# Patient Record
Sex: Female | Born: 1955 | Race: Black or African American | Hispanic: No | Marital: Married | Smoking: Former smoker
Health system: Southern US, Community
[De-identification: ages and names within clinical notes are randomized; demographics above are authoritative.]

## PROBLEM LIST (undated history)

## (undated) HISTORY — PX: OTHER SURGICAL HISTORY: SHX169

---

## 2021-01-19 ENCOUNTER — Emergency Department (HOSPITAL_BASED_OUTPATIENT_CLINIC_OR_DEPARTMENT_OTHER): Payer: BLUE CROSS/BLUE SHIELD

## 2021-01-19 ENCOUNTER — Encounter (HOSPITAL_BASED_OUTPATIENT_CLINIC_OR_DEPARTMENT_OTHER): Payer: Self-pay

## 2021-01-19 ENCOUNTER — Emergency Department (HOSPITAL_BASED_OUTPATIENT_CLINIC_OR_DEPARTMENT_OTHER)
Admission: EM | Admit: 2021-01-19 | Discharge: 2021-01-19 | Disposition: A | Discharge status: Home / Self Care | Payer: BLUE CROSS/BLUE SHIELD | Attending: Emergency Medicine | Admitting: Emergency Medicine

## 2021-01-19 ENCOUNTER — Other Ambulatory Visit: Payer: Self-pay

## 2021-01-19 DIAGNOSIS — Z87891 Personal history of nicotine dependence: Secondary | ICD-10-CM | POA: Insufficient documentation

## 2021-01-19 DIAGNOSIS — L03114 Cellulitis of left upper limb: Secondary | ICD-10-CM | POA: Insufficient documentation

## 2021-01-19 DIAGNOSIS — M25522 Pain in left elbow: Secondary | ICD-10-CM | POA: Diagnosis present

## 2021-01-19 LAB — CBC WITH DIFFERENTIAL/PLATELET
Abs Immature Granulocytes: 0.02 10*3/uL (ref 0.00–0.07)
Basophils Absolute: 0 10*3/uL (ref 0.0–0.1)
Basophils Relative: 0 %
Eosinophils Absolute: 0 10*3/uL (ref 0.0–0.5)
Eosinophils Relative: 0 %
HCT: 46.4 % — ABNORMAL HIGH (ref 36.0–46.0)
Hemoglobin: 15 g/dL (ref 12.0–15.0)
Immature Granulocytes: 0 %
Lymphocytes Relative: 15 %
Lymphs Abs: 1.1 10*3/uL (ref 0.7–4.0)
MCH: 27.9 pg (ref 26.0–34.0)
MCHC: 32.3 g/dL (ref 30.0–36.0)
MCV: 86.2 fL (ref 80.0–100.0)
Monocytes Absolute: 0.6 10*3/uL (ref 0.1–1.0)
Monocytes Relative: 8 %
Neutro Abs: 5.6 10*3/uL (ref 1.7–7.7)
Neutrophils Relative %: 77 %
Platelets: 200 10*3/uL (ref 150–400)
RBC: 5.38 MIL/uL — ABNORMAL HIGH (ref 3.87–5.11)
RDW: 13.8 % (ref 11.5–15.5)
WBC: 7.3 10*3/uL (ref 4.0–10.5)
nRBC: 0 % (ref 0.0–0.2)

## 2021-01-19 LAB — BASIC METABOLIC PANEL
Anion gap: 11 (ref 5–15)
BUN: 12 mg/dL (ref 8–23)
CO2: 25 mmol/L (ref 22–32)
Calcium: 8.6 mg/dL — ABNORMAL LOW (ref 8.9–10.3)
Chloride: 97 mmol/L — ABNORMAL LOW (ref 98–111)
Creatinine, Ser: 0.57 mg/dL (ref 0.44–1.00)
GFR, Estimated: >60 mL/min (ref >60)
Glucose, Bld: 115 mg/dL — ABNORMAL HIGH (ref 70–99)
Potassium: 3.3 mmol/L — ABNORMAL LOW (ref 3.5–5.1)
Sodium: 133 mmol/L — ABNORMAL LOW (ref 135–145)

## 2021-01-19 MED ORDER — DOXYCYCLINE HYCLATE 100 MG PO TABS
100.0000 mg | ORAL_TABLET | Freq: Once | ORAL | Status: AC
  Administered 2021-01-19: 100 mg via ORAL
  Filled 2021-01-19: qty 1

## 2021-01-19 MED ORDER — KETOROLAC TROMETHAMINE 30 MG/ML IJ SOLN
30.0000 mg | Freq: Once | INTRAMUSCULAR | Status: AC
  Administered 2021-01-19: 30 mg via INTRAVENOUS
  Filled 2021-01-19: qty 1

## 2021-01-19 MED ORDER — IOHEXOL 300 MG/ML  SOLN
100.0000 mL | Freq: Once | INTRAMUSCULAR | Status: AC | PRN
  Administered 2021-01-19: 100 mL via INTRAVENOUS

## 2021-01-19 MED ORDER — DOXYCYCLINE HYCLATE 100 MG PO CAPS: 100.0000 mg | ORAL_CAPSULE | Freq: Two times a day (BID) | ORAL | 0 refills | Status: AC

## 2021-01-19 NOTE — ED Notes (Signed)
Assumed care of this patient. A&Ox4. NAD. Respirations regular/unlabored. Connected to BP and pulse ox. Stretcher low, wheels locked, call bell within reach.

## 2021-01-19 NOTE — ED Triage Notes (Signed)
Pt c/o redness, pain, swelling to left UE x 1 week-denies injury-NAD-steady gait

## 2021-01-19 NOTE — ED Provider Notes (Signed)
[facility] EMERGENCY DEPARTMENT Provider Note   CSN: [id number] Arrival date & time: 01/19/21  2019     History Chief Complaint  Patient presents with  . Arm Problem    Kathryn Montes is a 65 y.o. female.  The history is provided by the patient.  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 No cause for the redness Relieved by:  Nothing Worsened by:  Nothing Ineffective treatments:  None tried Associated symptoms: no abdominal pain, no fever, no joint pain, no periorbital edema, no shortness of breath, no sore throat and not vomiting        History reviewed. No pertinent past medical history.  There are no problems to display for this patient.   Past Surgical History:  Procedure Laterality Date  . arm surgery       OB History   No obstetric history on file.     No family history on file.  Social History   Tobacco Use  . Smoking status: Former Games developer  . Smokeless tobacco: Never Used  Vaping Use  . Vaping Use: Never used  Substance Use Topics  . Alcohol use: Yes    Comment: occ  . Drug use: Never    Home Medications Prior to Admission medications   Not on File    Allergies    Patient has no known allergies.  Review of Systems   Review of Systems  Constitutional: Positive for appetite change. Negative for chills and fever.       Hasn't been eating as much  HENT: Negative for ear pain and sore throat.   Eyes: Negative for pain and visual disturbance.  Respiratory: Negative for cough and shortness of breath.    Cardiovascular: Positive for chest pain (central pain worse when changing positions). Negative for palpitations.  Gastrointestinal: Negative for abdominal pain and vomiting.  Genitourinary: Negative for dysuria and hematuria.  Musculoskeletal: Positive for joint swelling. Negative for arthralgias and back pain.  Skin: Positive for rash. Negative for color change.  Neurological: Negative for seizures and syncope.  All other systems reviewed and are negative.   Physical Exam Updated Vital Signs BP 132/72 (BP Location: Right Arm)   Pulse 96   Temp 99.5 F (37.5 C) (Oral)   Resp 20   Ht 5\' 6"  (1.676 m)   Wt 88.5 kg   SpO2 100%   BMI 31.47 kg/m   Physical Exam Vitals and nursing note reviewed.  Constitutional:      General: She is not in acute distress.    Appearance: She is well-developed.  HENT:     Head: Normocephalic and atraumatic.  Eyes:     Conjunctiva/sclera: Conjunctivae normal.  Cardiovascular:     Rate and Rhythm: Normal rate and regular rhythm.     Heart sounds: No murmur heard.   Pulmonary:     Effort: Pulmonary effort is normal. No respiratory distress.     Breath sounds: Normal breath sounds.  Musculoskeletal:     Cervical back: Neck supple.  Comments: There is a well-healed incision at the medial aspect of the left elbow.  There is swelling beneath this incision and the suggestion of a mass versus fluctuance that includes and extends circumferentially about the medial epicondyle.  There is warmth of the overlying skin and erythema.  Erythema streaks down the ulnar aspect of the forearm all the way to the wrist.  Skin:    General: Skin is warm and dry.  Neurological:     General: No focal deficit present.     Mental Status: She is alert.  Psychiatric:        Mood and Affect: Mood normal.     ED Results / Procedures / Treatments   Labs (all labs ordered are listed, but only abnormal results are displayed) Labs Reviewed  CBC WITH  DIFFERENTIAL/PLATELET - Abnormal; Notable for the following components:      Result Value   RBC 5.38 (*)    HCT 46.4 (*)    All other components within normal limits  BASIC METABOLIC PANEL - Abnormal; Notable for the following components:   Sodium 133 (*)    Potassium 3.3 (*)    Chloride 97 (*)    Glucose, Bld 115 (*)    Calcium 8.6 (*)    All other components within normal limits    EKG None  Radiology DG Elbow 2 Views Left  Result Date: 01/19/2021 CLINICAL DATA:  Left elbow pain, redness, and swelling for 1 week. No known injury. EXAM: LEFT ELBOW - 2 VIEW COMPARISON:  None. FINDINGS: There is no evidence of fracture, dislocation, or joint effusion. No erosion, bony destruction or periosteal reaction. Probable bone island in the distal humerus. Minimal enthesopathic change about the lateral humeral epicondyle. Prominent soft tissue thickening and edema is noted posterior medially. Soft tissue edema appears to extend distally in the forearm. No soft tissue air. No radiopaque foreign body. IMPRESSION: Prominent soft tissue thickening and edema posterior medially suggesting, nonspecific. This could represent cellulitis in the appropriate clinical setting. Gout is also considered. No soft tissue air. No osseous abnormality. Electronically Signed   By: Narda Rutherford M.D.   On: 01/19/2021 21:38   CT ELBOW LEFT W CONTRAST  Result Date: 01/19/2021 CLINICAL DATA:  Left elbow pain.  Swelling. EXAM: CT OF THE UPPER LEFT EXTREMITY WITH CONTRAST TECHNIQUE: Multidetector CT imaging of the upper left extremity was performed according to the standard protocol following intravenous contrast administration. CONTRAST:  OMNIPAQUE IOHEXOL 300 MG/ML  SOLN COMPARISON:  X-ray left elbow 01/19/2021 FINDINGS: Bones/Joint/Cartilage No acute displaced fracture. No dislocation. No cortical erosion or destruction. Densely sclerotic density within the humerus likely represents a bone island. No suspicious lytic or  blastic osseous lesion. Ligaments Suboptimally assessed by CT. Muscles and Tendons Grossly unremarkable. Soft tissues 4 cm soft tissue density with associated surrounding subcutaneus soft tissue edema along the medial elbow. IMPRESSION: 1. Nonspecific 4 cm soft tissue density with associated surrounding subcutaneus soft tissue edema along the medial elbow. 2. No acute osseous abnormality. Electronically Signed   By: Tish Frederickson M.D.   On: 01/19/2021 22:33    Procedures Procedures   Medications Ordered in ED Medications  ketorolac (TORADOL) 30 MG/ML injection 30 mg (has no administration in time range)    ED Course  I have reviewed the triage vital signs and the nursing notes.  Pertinent labs & imaging results that were available during my care of the patient were reviewed by me and considered in my medical decision making (  see chart for details).    MDM Rules/Calculators/A&P                          Kathryn Montes presents complaining of redness and pain to the medial aspect of her left elbow.  She has a cyst that has been present for quite some time, and it appears to be the nidus of infection.  No evidence of an abscess.  The patient is clinically well-appearing, and her white count is normal.  I think it is acceptable to try outpatient management.  She understands that she will need to follow-up if symptoms fail to resolve or if they progress in any way.  After the acute infection has resolved, it would be appropriate to see a specialist to inquire about biopsy or removal of the cyst as her symptoms dictate. Final Clinical Impression(s) / ED Diagnoses Final diagnoses:  Cellulitis of left upper extremity    Rx / DC Orders ED Discharge Orders         Ordered    doxycycline (VIBRAMYCIN) 100 MG capsule  2 times daily        01/19/21 2240    Consult to care management       Comments: Needs to establish care with PCP  Provider:  (Not yet assigned)   01/19/21 2247            Koleen Distance, MD 01/19/21 2249

## 2022-06-23 IMAGING — CT CT ELBOW*L* W/CM
3 of 4 series · 16 of 36 positions shown, 19 images · IV contrast (omnipaque)
Comparison: X-ray left elbow 01/19/2021

CLINICAL DATA: Left elbow pain.  Swelling.

EXAM:
CT OF THE UPPER LEFT EXTREMITY WITH CONTRAST
TECHNIQUE: Multidetector CT imaging of the upper left extremity was performed
according to the standard protocol following intravenous contrast
administration.
CONTRAST:  100mL OMNIPAQUE IOHEXOL 300 MG/ML  SOLN

[Series 5: cor bone · coronal · 0.32mm/px · 1 of 201 slices shown]
[im 101/201  bone]
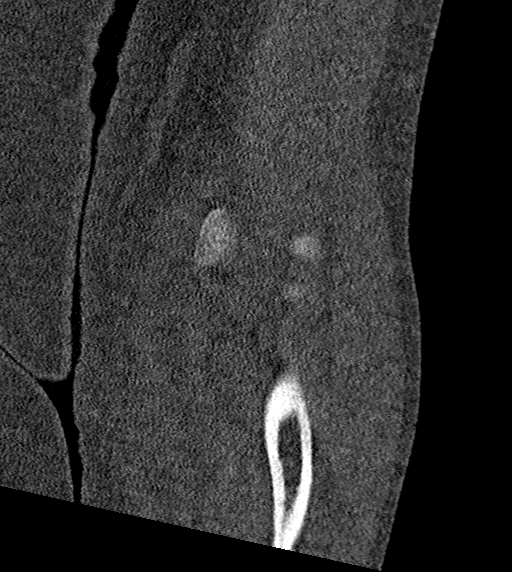

[Series 7: ax st · axial · 0.39mm/px · z∈[-209,-58]mm · 9 of 181 slices shown, 12 images]
[im 14/181  soft-tissue]
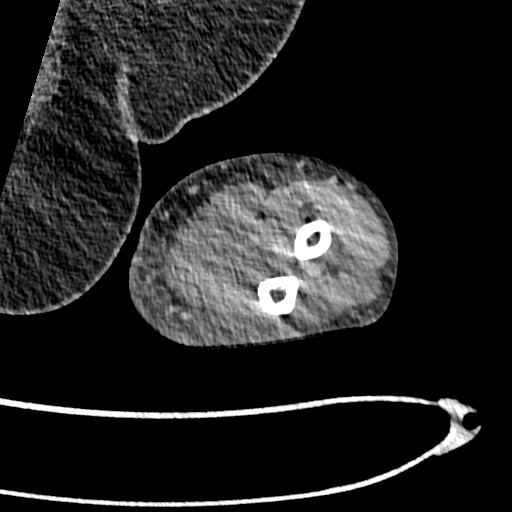
[im 14/181  bone]
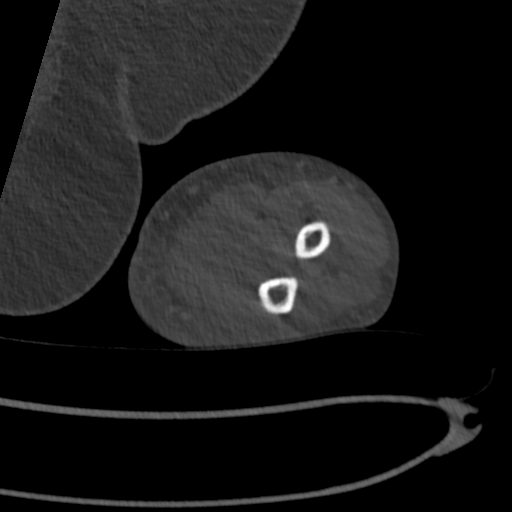
[im 42/181  bone]
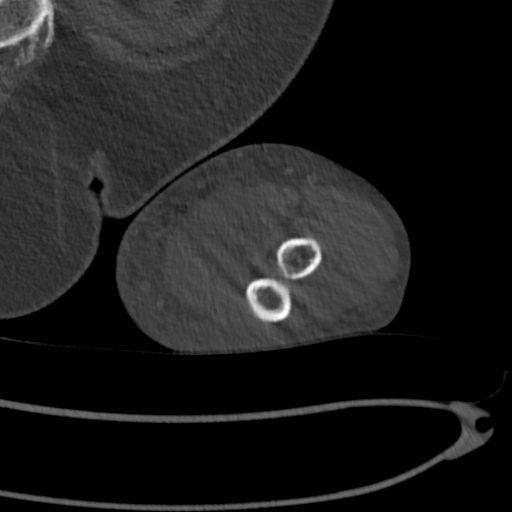
[im 56/181  bone]
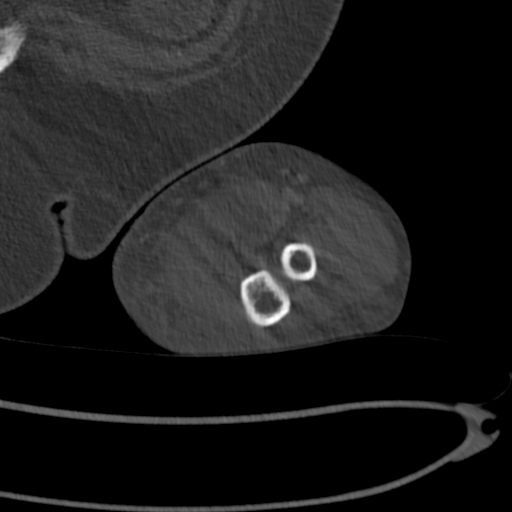
[im 70/181  bone]
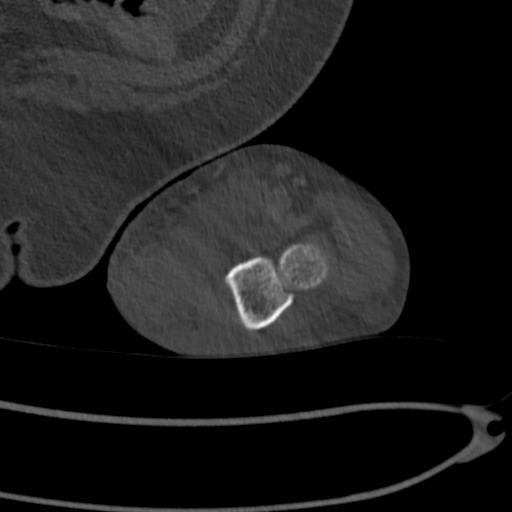
[im 97/181  soft-tissue]
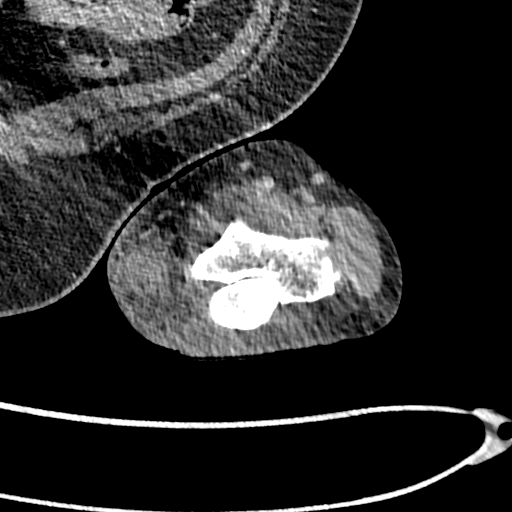
[im 97/181  bone]
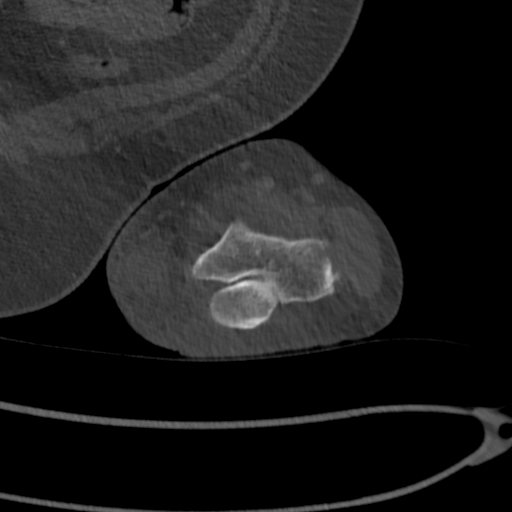
[im 111/181  bone]
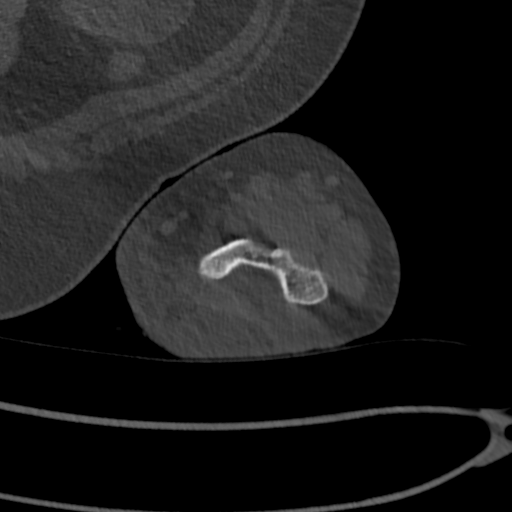
[im 125/181  bone]
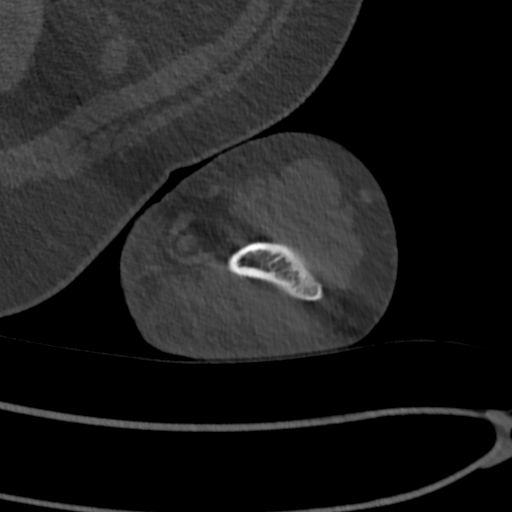
[im 153/181  bone]
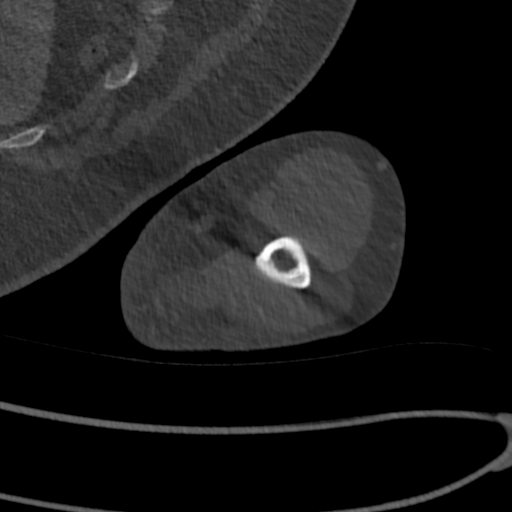
[im 167/181  soft-tissue]
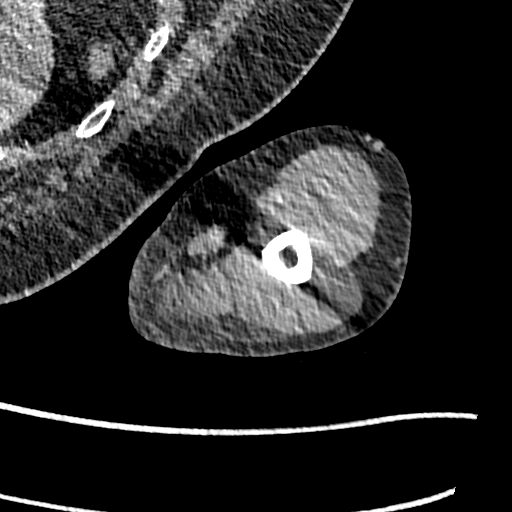
[im 167/181  bone]
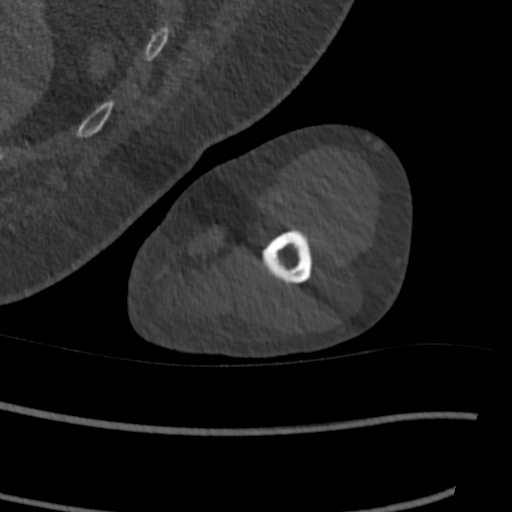

[Series 9: sag st · sagittal · 0.31mm/px · 6 of 190 slices shown]
[im 64/190  bone]
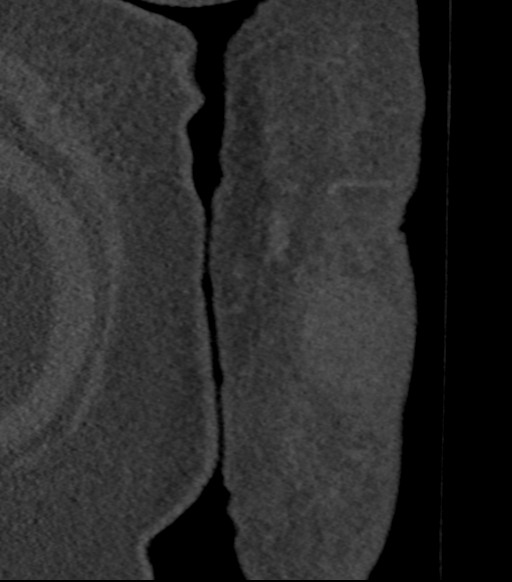
[im 79/190  bone]
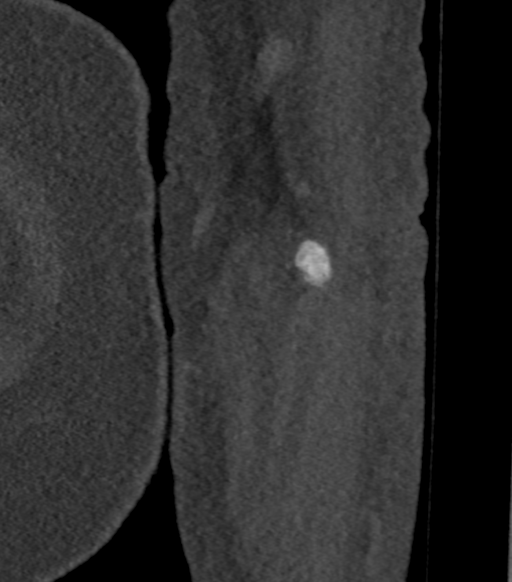
[im 95/190  bone]
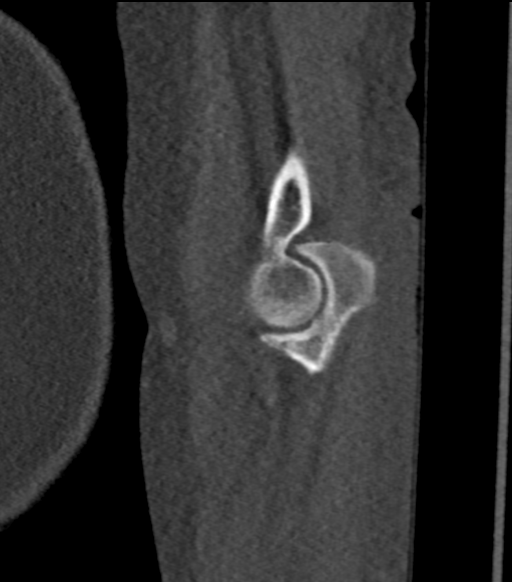
[im 101/190  soft-tissue]
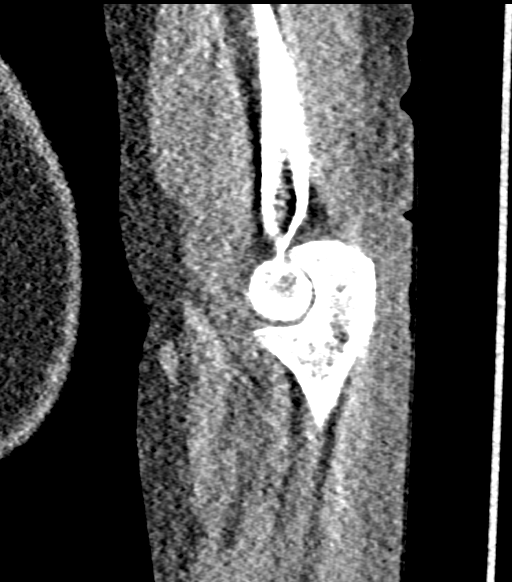
[im 111/190  bone]
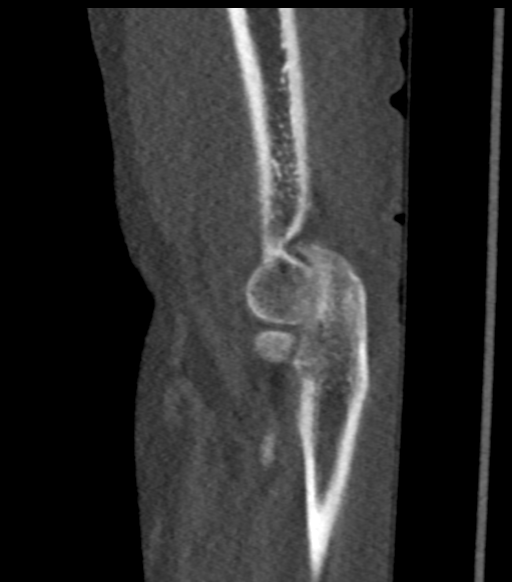
[im 127/190  bone]
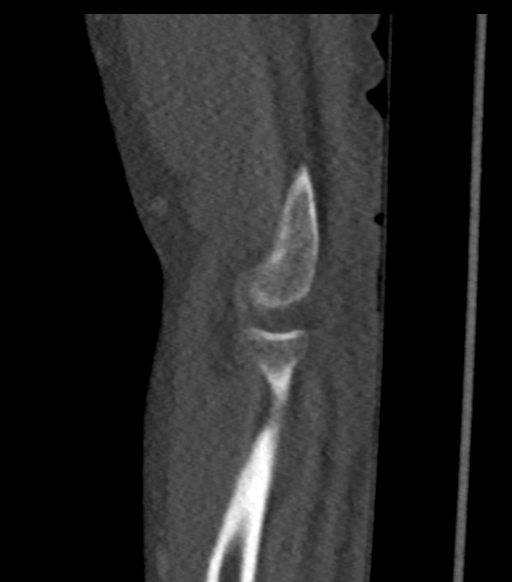

[16 of 36 positions shown; findings below may reference images not displayed]

FINDINGS: Bones/Joint/Cartilage

No acute displaced fracture. No dislocation. No cortical erosion or
destruction. Densely sclerotic density within the humerus likely
represents a bone island. No suspicious lytic or blastic osseous
lesion.

Ligaments

Suboptimally assessed by CT.

Muscles and Tendons

Grossly unremarkable.

Soft tissues

4 cm soft tissue density with associated surrounding subcutaneus
soft tissue edema along the medial elbow.
IMPRESSION: 1. Nonspecific 4 cm soft tissue density with associated surrounding
subcutaneus soft tissue edema along the medial elbow.
2. No acute osseous abnormality.

## 2023-07-01 ENCOUNTER — Emergency Department (HOSPITAL_BASED_OUTPATIENT_CLINIC_OR_DEPARTMENT_OTHER)
Admission: EM | Admit: 2023-07-01 | Discharge: 2023-07-01 | Disposition: A | Discharge status: Home / Self Care | Payer: No Typology Code available for payment source | Attending: Emergency Medicine | Admitting: Emergency Medicine

## 2023-07-01 ENCOUNTER — Encounter (HOSPITAL_BASED_OUTPATIENT_CLINIC_OR_DEPARTMENT_OTHER): Payer: Self-pay | Admitting: Urology

## 2023-07-01 DIAGNOSIS — M545 Low back pain, unspecified: Secondary | ICD-10-CM | POA: Diagnosis present

## 2023-07-01 DIAGNOSIS — M25561 Pain in right knee: Secondary | ICD-10-CM | POA: Insufficient documentation

## 2023-07-01 DIAGNOSIS — M25562 Pain in left knee: Secondary | ICD-10-CM | POA: Diagnosis not present

## 2023-07-01 MED ORDER — MELOXICAM 7.5 MG PO TABS: 7.5000 mg | ORAL_TABLET | Freq: Every day | ORAL | 0 refills | Status: DC
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
## 2023-07-08 ENCOUNTER — Other Ambulatory Visit: Payer: Self-pay

## 2023-07-08 ENCOUNTER — Encounter (HOSPITAL_BASED_OUTPATIENT_CLINIC_OR_DEPARTMENT_OTHER): Payer: Self-pay | Admitting: Emergency Medicine

## 2023-07-08 ENCOUNTER — Emergency Department (HOSPITAL_BASED_OUTPATIENT_CLINIC_OR_DEPARTMENT_OTHER)
Admission: EM | Admit: 2023-07-08 | Discharge: 2023-07-08 | Discharge status: Against Medical Advice | Payer: No Typology Code available for payment source | Attending: Emergency Medicine | Admitting: Emergency Medicine

## 2023-07-08 DIAGNOSIS — M549 Dorsalgia, unspecified: Secondary | ICD-10-CM | POA: Diagnosis present

## 2023-07-08 DIAGNOSIS — Y92410 Unspecified street and highway as the place of occurrence of the external cause: Secondary | ICD-10-CM | POA: Diagnosis not present

## 2023-07-08 DIAGNOSIS — Z5321 Procedure and treatment not carried out due to patient leaving prior to being seen by health care provider: Secondary | ICD-10-CM | POA: Insufficient documentation
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
## 2023-07-10 ENCOUNTER — Emergency Department (HOSPITAL_BASED_OUTPATIENT_CLINIC_OR_DEPARTMENT_OTHER)
Admission: EM | Admit: 2023-07-10 | Discharge: 2023-07-10 | Disposition: A | Discharge status: Home / Self Care | Payer: Medicare HMO | Attending: Emergency Medicine | Admitting: Emergency Medicine

## 2023-07-10 ENCOUNTER — Encounter (HOSPITAL_BASED_OUTPATIENT_CLINIC_OR_DEPARTMENT_OTHER): Payer: Self-pay | Admitting: Pediatrics

## 2023-07-10 ENCOUNTER — Other Ambulatory Visit: Payer: Self-pay

## 2023-07-10 DIAGNOSIS — M546 Pain in thoracic spine: Secondary | ICD-10-CM | POA: Diagnosis present

## 2023-07-10 DIAGNOSIS — M62838 Other muscle spasm: Secondary | ICD-10-CM

## 2023-07-10 DIAGNOSIS — M62830 Muscle spasm of back: Secondary | ICD-10-CM | POA: Insufficient documentation

## 2023-07-10 MED ORDER — CYCLOBENZAPRINE HCL 10 MG PO TABS: 10.0000 mg | ORAL_TABLET | Freq: Two times a day (BID) | ORAL | 0 refills | Status: DC | PRN

## 2023-07-10 MED ORDER — LIDOCAINE 5 % EX PTCH: 1.0000 | MEDICATED_PATCH | CUTANEOUS | 0 refills | Status: DC
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
## 2023-07-10 NOTE — Discharge Instructions (Signed)
Recommend 1000 mg of Tylenol every 6 hours as needed for pain.  I prescribed you a muscle relaxant called Flexeril to take as prescribed.  This medication is sedating so please be careful with its use.  I have sent in a prescription for lidocaine patch but this can also be bought over-the-counter.

## 2024-02-28 ENCOUNTER — Emergency Department (HOSPITAL_BASED_OUTPATIENT_CLINIC_OR_DEPARTMENT_OTHER)

## 2024-02-28 ENCOUNTER — Emergency Department (HOSPITAL_BASED_OUTPATIENT_CLINIC_OR_DEPARTMENT_OTHER)
Admission: EM | Admit: 2024-02-28 | Discharge: 2024-02-28 | Disposition: A | Discharge status: Home / Self Care | Attending: Emergency Medicine | Admitting: Emergency Medicine

## 2024-02-28 ENCOUNTER — Other Ambulatory Visit: Payer: Self-pay

## 2024-02-28 ENCOUNTER — Emergency Department (HOSPITAL_COMMUNITY)

## 2024-02-28 DIAGNOSIS — G510 Bell's palsy: Secondary | ICD-10-CM | POA: Insufficient documentation

## 2024-02-28 DIAGNOSIS — R479 Unspecified speech disturbances: Secondary | ICD-10-CM | POA: Diagnosis not present

## 2024-02-28 DIAGNOSIS — R413 Other amnesia: Secondary | ICD-10-CM | POA: Diagnosis not present

## 2024-02-28 DIAGNOSIS — Z79899 Other long term (current) drug therapy: Secondary | ICD-10-CM | POA: Insufficient documentation

## 2024-02-28 DIAGNOSIS — Z8669 Personal history of other diseases of the nervous system and sense organs: Secondary | ICD-10-CM

## 2024-02-28 DIAGNOSIS — R4182 Altered mental status, unspecified: Secondary | ICD-10-CM | POA: Insufficient documentation

## 2024-02-28 LAB — COMPREHENSIVE METABOLIC PANEL WITH GFR
ALT: 7 U/L (ref 0–44)
AST: 19 U/L (ref 15–41)
Albumin: 4 g/dL (ref 3.5–5.0)
Alkaline Phosphatase: 95 U/L (ref 38–126)
Anion gap: 13 (ref 5–15)
BUN: 13 mg/dL (ref 8–23)
CO2: 23 mmol/L (ref 22–32)
Calcium: 9 mg/dL (ref 8.9–10.3)
Chloride: 106 mmol/L (ref 98–111)
Creatinine, Ser: 0.7 mg/dL (ref 0.44–1.00)
GFR, Estimated: >60 mL/min (ref >60)
Glucose, Bld: 90 mg/dL (ref 70–99)
Potassium: 3.7 mmol/L (ref 3.5–5.1)
Sodium: 142 mmol/L (ref 135–145)
Total Bilirubin: 0.7 mg/dL (ref 0.0–1.2)
Total Protein: 7.9 g/dL (ref 6.5–8.1)

## 2024-02-28 LAB — CBC
HCT: 35.1 % — ABNORMAL LOW (ref 36.0–46.0)
Hemoglobin: 11.1 g/dL — ABNORMAL LOW (ref 12.0–15.0)
MCH: 28.4 pg (ref 26.0–34.0)
MCHC: 31.6 g/dL (ref 30.0–36.0)
MCV: 89.8 fL (ref 80.0–100.0)
Platelets: 201 10*3/uL (ref 150–400)
RBC: 3.91 MIL/uL (ref 3.87–5.11)
RDW: 13.5 % (ref 11.5–15.5)
WBC: 5 10*3/uL (ref 4.0–10.5)
nRBC: 0 % (ref 0.0–0.2)

## 2024-02-28 LAB — URINALYSIS, MICROSCOPIC (REFLEX)

## 2024-02-28 LAB — URINALYSIS, ROUTINE W REFLEX MICROSCOPIC
Bilirubin Urine: NEGATIVE
Glucose, UA: NEGATIVE mg/dL
Ketones, ur: NEGATIVE mg/dL
Nitrite: NEGATIVE
Protein, ur: NEGATIVE mg/dL
Specific Gravity, Urine: 1.025 (ref 1.005–1.030)
pH: 6.5 (ref 5.0–8.0)

## 2024-02-28 LAB — CBG MONITORING, ED: Glucose-Capillary: 88 mg/dL (ref 70–99)

## 2024-02-28 NOTE — Discharge Instructions (Signed)
 As discussed, your evaluation today has been largely reassuring.  But, it is important that you monitor your condition carefully, and do not hesitate to return to the ED if you develop new, or concerning changes in your condition.  Please follow-up with your primary care physician and our neurology team will contact you for follow-up visit in the next week or so.

## 2024-02-28 NOTE — ED Notes (Signed)
 Pt left ambulatory with son to go to [facility] ER

## 2024-02-28 NOTE — ED Triage Notes (Addendum)
 Pt having confusion/memory issues for 2-3 years.  Pt has been seen for same but decided to come in to get checked. No weakness, no numbness and tingling, no slurred speech.  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  Strength good.  No decreased sensation.
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
## 2024-02-28 NOTE — ED Notes (Signed)
 Pt gping POV to [facility] hospital  son  Kathryn Montes given all instructions about not stopping anywhere and going straight to [facility]   I V was secured

## 2024-02-28 NOTE — ED Notes (Signed)
 Patient transported to CT

## 2024-02-28 NOTE — ED Provider Notes (Signed)
 [facility] Provider Note   CSN: [id number] Arrival date & time: 02/28/24  0102     History  Chief Complaint  Patient presents with   Altered Mental Status    Kathryn Montes is a 68 y.o. female.  HPI Both the patient and herself have noticed difficulties with memory and speech.  This has been incremental and started a couple of years ago.  However they have noted worsening of the symptoms.  Her family member reports that the patient does not seem to have any difficulty with comprehension but sometimes trying to say what she wants to say seems to be a real problem.  The patient reports that she is aware of this and she has to wait and try to get the words and thoughts out.  At times this is worse than others.  Otherwise she reports she is felt really good.  She is active she takes long walks and denies any weakness numbness or tingling of arms or legs.  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  This has been stable and they have not noted any changes.    Home Medications Prior to Admission medications   Medication Sig Start Date End Date Taking? Authorizing Provider  atorvastatin (LIPITOR) 20 MG tablet Take 20 mg by mouth daily.   Yes [provider]  donepezil (ARICEPT) 10 MG tablet Take 10 mg by mouth at bedtime.   Yes [provider]      Allergies    Patient has no known allergies.    Review of Systems   Review of Systems  Physical Exam Updated Vital Signs BP (!) 162/87   Pulse (!) 58   Temp (!) 97.5 F (36.4 C)   Resp 16   Ht 5\' 6"  (1.676 m)   Wt 76.6 kg   SpO2 100%   BMI 27.26 kg/m  Physical Exam Constitutional:      Comments: Patient is alert.  No somnolence.  No respiratory distress well-nourished well-developed  HENT:     Head: Normocephalic and atraumatic.     Mouth/Throat:     Mouth: Mucous membranes are moist.     Pharynx: Oropharynx is clear.  Eyes:     Extraocular  Movements: Extraocular movements intact.     Pupils: Pupils are equal, round, and reactive to light.  Cardiovascular:     Rate and Rhythm: Normal rate and regular rhythm.  Pulmonary:     Effort: Pulmonary effort is normal.     Breath sounds: Normal breath sounds.  Abdominal:     General: There is no distension.     Palpations: Abdomen is soft.     Tenderness: There is no abdominal tenderness. There is no guarding.  Musculoskeletal:        General: Normal range of motion.     Cervical back: Neck supple.  Skin:    General: Skin is warm and dry.  Neurological:     Comments: Patient is alert.  Interactive without difficulty.  Patient has facial droop on the right but qualifies this as chronic.  Grip strength 5\5.  Upper extremity strength push pull 5\5.  Lower extremity 5/5.  Finger-nose exam intact.  Patient speech is delayed in output.  She manages to complete her thought and is appropriately oriented but there is delay in speech production.  She does not appear overtly confused.  Psychiatric:        Mood and Affect:  Mood normal.     ED Results / Procedures / Treatments   Labs (all labs ordered are listed, but only abnormal results are displayed) Labs Reviewed  CBC - Abnormal; Notable for the following components:      Result Value   Hemoglobin 11.1 (*)    HCT 35.1 (*)    All other components within normal limits  URINALYSIS, ROUTINE W REFLEX MICROSCOPIC - Abnormal; Notable for the following components:   Hgb urine dipstick SMALL (*)    Leukocytes,Ua TRACE (*)    All other components within normal limits  URINALYSIS, MICROSCOPIC (REFLEX) - Abnormal; Notable for the following components:   Bacteria, UA RARE (*)    All other components within normal limits  COMPREHENSIVE METABOLIC PANEL WITH GFR  CBG MONITORING, ED  CBG MONITORING, ED    EKG EKG Interpretation Date/Time:  Friday Feb 28 2024 10:21:06 EDT Ventricular Rate:  56 PR Interval:  152 QRS Duration:  86 QT  Interval:  436 QTC Calculation: 420 R Axis:   87  Text Interpretation: Sinus bradycardia Otherwise normal ECG No previous ECGs available agree, no old comparison Confirmed by Wynetta Heckle [phone]) on 02/28/2024 10:52:11 AM  Radiology CT HEAD WO CONTRAST Result Date: 02/28/2024 CLINICAL DATA:  68 year old female with confusion and memory issues. EXAM: CT HEAD WITHOUT CONTRAST TECHNIQUE: Contiguous axial images were obtained from the base of the skull through the vertex without intravenous contrast. RADIATION DOSE REDUCTION: This exam was performed according to the departmental dose-optimization program which includes automated exposure control, adjustment of the mA and/or kV according to patient size and/or use of iterative reconstruction technique. COMPARISON:  Report of Head CT 09/25/2014 (no images available). FINDINGS: Brain: Cerebral volume is within normal limits for age. No midline shift, ventriculomegaly, mass effect, evidence of mass lesion, intracranial hemorrhage or evidence of cortically based acute infarction. Gray-white matter differentiation is within normal limits throughout the brain. *CRASH * that patchy mild to moderate for age scattered bilateral cerebral white matter hypodensity. Otherwise gray-white differentiation within normal limits. No cortical encephalomalacia identified. Mesial temporal lobe volume maintained. Vascular: No suspicious intracranial vascular hyperdensity. Calcified atherosclerosis at the skull base. Skull: Intact. Hyperostosis frontalis, normal variant. No acute osseous abnormality identified. Sinuses/Orbits: Visualized paranasal sinuses and mastoids are clear. Other: Visualized orbits and scalp soft tissues are within normal limits. IMPRESSION: 1. No acute intracranial abnormality. 2. Mild to moderate for age cerebral white matter changes, most commonly due to small vessel disease. Electronically Signed   By: Marlise Simpers M.D.   On: 02/28/2024 10:56     Procedures Procedures    Medications Ordered in ED Medications - No data to display  ED Course/ Medical Decision Making/ A&P                                 Medical Decision Making Amount and/or Complexity of Data Reviewed Labs: ordered. Radiology: ordered.   Presents as outlined.  On examination she does have notable facial asymmetry but this is a chronic baseline by her history.  Patient does have delays in speech production that are suggestive of an expressive aphasia.  Her motor exam was otherwise intact.  At this time family and patient are very concerned about possible stroke.  They have been making appointments with neurology but apparently appointments have gotten canceled or pushed back repeatedly and now they are out to August.  Will proceed with metabolic evaluation and CT head.  His blood pressures are moderately elevated at 150s over 80s.  At this no signs of hypertensive encephalopathy.  Patient does not have headache and otherwise well.  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  Metabolic panel within normal limits.  No sign of metabolic derangement.  White count 5.0.  Urinalysis negative.  At this time we will plan for MRI to rule out older or subacute stroke.  Patient does not meet stroke criteria and at this time is stable.  Will proceed with MRI and if MRI does not show any acute findings patient will be stable for continued outpatient follow-up with neurology as planned.  Will be transferred to [facility] emergency department for MRI.        Final Clinical Impression(s) / ED Diagnoses Final diagnoses:  Memory loss  Speech disturbance, unspecified type  History of Bell's palsy    Rx / DC Orders ED Discharge Orders     None         Wynetta Heckle, MD 02/28/24 1438
